# Patient Record
Sex: Female | Born: 1935 | Race: Black or African American | Hispanic: No | Smoking: Never smoker
Health system: Southern US, Community
[De-identification: ages and names within clinical notes are randomized; demographics above are authoritative.]

## PROBLEM LIST (undated history)

## (undated) DIAGNOSIS — R55 Syncope and collapse: Secondary | ICD-10-CM

## (undated) DIAGNOSIS — I509 Heart failure, unspecified: Secondary | ICD-10-CM

## (undated) DIAGNOSIS — E785 Hyperlipidemia, unspecified: Secondary | ICD-10-CM

## (undated) DIAGNOSIS — I10 Essential (primary) hypertension: Secondary | ICD-10-CM

## (undated) HISTORY — DX: Hyperlipidemia, unspecified: E78.5

## (undated) HISTORY — DX: Essential (primary) hypertension: I10

## (undated) HISTORY — DX: Syncope and collapse: R55

---

## 1997-12-10 ENCOUNTER — Ambulatory Visit (HOSPITAL_COMMUNITY): Admission: RE | Admit: 1997-12-10 | Discharge: 1997-12-10 | Payer: Self-pay | Admitting: Gastroenterology

## 1999-04-10 ENCOUNTER — Other Ambulatory Visit: Admission: RE | Admit: 1999-04-10 | Discharge: 1999-04-10 | Payer: Self-pay | Admitting: Obstetrics and Gynecology

## 1999-04-10 ENCOUNTER — Encounter (INDEPENDENT_AMBULATORY_CARE_PROVIDER_SITE_OTHER): Payer: Self-pay | Admitting: Specialist

## 1999-07-01 ENCOUNTER — Encounter: Admission: RE | Admit: 1999-07-01 | Discharge: 1999-07-01 | Payer: Self-pay | Admitting: Family Medicine

## 1999-07-01 ENCOUNTER — Encounter: Payer: Self-pay | Admitting: Family Medicine

## 2000-02-03 ENCOUNTER — Encounter: Admission: RE | Admit: 2000-02-03 | Discharge: 2000-02-03 | Payer: Self-pay | Admitting: Surgery

## 2000-02-03 ENCOUNTER — Encounter: Payer: Self-pay | Admitting: Surgery

## 2000-10-26 ENCOUNTER — Encounter: Payer: Self-pay | Admitting: Obstetrics and Gynecology

## 2000-10-28 ENCOUNTER — Encounter (INDEPENDENT_AMBULATORY_CARE_PROVIDER_SITE_OTHER): Payer: Self-pay | Admitting: Specialist

## 2000-10-28 ENCOUNTER — Ambulatory Visit (HOSPITAL_COMMUNITY): Admission: RE | Admit: 2000-10-28 | Discharge: 2000-10-28 | Payer: Self-pay | Admitting: Obstetrics and Gynecology

## 2000-12-31 ENCOUNTER — Encounter: Payer: Self-pay | Admitting: Endocrinology

## 2000-12-31 ENCOUNTER — Encounter: Admission: RE | Admit: 2000-12-31 | Discharge: 2000-12-31 | Payer: Self-pay | Admitting: Endocrinology

## 2001-01-19 ENCOUNTER — Encounter: Payer: Self-pay | Admitting: Otolaryngology

## 2001-01-19 ENCOUNTER — Encounter: Admission: RE | Admit: 2001-01-19 | Discharge: 2001-01-19 | Payer: Self-pay | Admitting: Otolaryngology

## 2001-07-04 ENCOUNTER — Encounter: Payer: Self-pay | Admitting: Orthopedic Surgery

## 2001-07-04 ENCOUNTER — Encounter: Admission: RE | Admit: 2001-07-04 | Discharge: 2001-07-04 | Payer: Self-pay | Admitting: Orthopedic Surgery

## 2001-07-05 ENCOUNTER — Ambulatory Visit (HOSPITAL_BASED_OUTPATIENT_CLINIC_OR_DEPARTMENT_OTHER): Admission: RE | Admit: 2001-07-05 | Discharge: 2001-07-05 | Payer: Self-pay | Admitting: Orthopedic Surgery

## 2004-01-29 ENCOUNTER — Encounter: Admission: RE | Admit: 2004-01-29 | Discharge: 2004-01-29 | Payer: Self-pay | Admitting: Family Medicine

## 2004-06-10 ENCOUNTER — Encounter: Admission: RE | Admit: 2004-06-10 | Discharge: 2004-06-10 | Payer: Self-pay | Admitting: Family Medicine

## 2004-12-15 ENCOUNTER — Encounter: Admission: RE | Admit: 2004-12-15 | Discharge: 2004-12-15 | Payer: Self-pay | Admitting: Orthopedic Surgery

## 2004-12-18 ENCOUNTER — Ambulatory Visit (HOSPITAL_BASED_OUTPATIENT_CLINIC_OR_DEPARTMENT_OTHER): Admission: RE | Admit: 2004-12-18 | Discharge: 2004-12-18 | Payer: Self-pay | Admitting: Orthopedic Surgery

## 2004-12-18 ENCOUNTER — Ambulatory Visit (HOSPITAL_COMMUNITY): Admission: RE | Admit: 2004-12-18 | Discharge: 2004-12-18 | Payer: Self-pay | Admitting: Orthopedic Surgery

## 2005-01-15 ENCOUNTER — Other Ambulatory Visit: Admission: RE | Admit: 2005-01-15 | Discharge: 2005-01-15 | Payer: Self-pay | Admitting: Family Medicine

## 2005-04-06 ENCOUNTER — Encounter: Admission: RE | Admit: 2005-04-06 | Discharge: 2005-04-06 | Payer: Self-pay | Admitting: Family Medicine

## 2005-09-18 ENCOUNTER — Emergency Department (HOSPITAL_COMMUNITY): Admission: EM | Admit: 2005-09-18 | Discharge: 2005-09-18 | Payer: Self-pay | Admitting: Family Medicine

## 2005-09-18 ENCOUNTER — Ambulatory Visit (HOSPITAL_COMMUNITY): Admission: RE | Admit: 2005-09-18 | Discharge: 2005-09-18 | Payer: Self-pay | Admitting: Family Medicine

## 2006-06-01 ENCOUNTER — Encounter: Admission: RE | Admit: 2006-06-01 | Discharge: 2006-06-01 | Payer: Self-pay | Admitting: Family Medicine

## 2007-09-21 ENCOUNTER — Encounter: Admission: RE | Admit: 2007-09-21 | Discharge: 2007-09-21 | Payer: Self-pay | Admitting: Family Medicine

## 2007-10-06 ENCOUNTER — Encounter: Admission: RE | Admit: 2007-10-06 | Discharge: 2007-10-06 | Payer: Self-pay | Admitting: Family Medicine

## 2008-01-12 ENCOUNTER — Emergency Department (HOSPITAL_COMMUNITY): Admission: EM | Admit: 2008-01-12 | Discharge: 2008-01-12 | Payer: Self-pay | Admitting: Emergency Medicine

## 2009-07-23 ENCOUNTER — Encounter: Admission: RE | Admit: 2009-07-23 | Discharge: 2009-07-23 | Payer: Self-pay | Admitting: Family Medicine

## 2009-08-19 ENCOUNTER — Encounter: Admission: RE | Admit: 2009-08-19 | Discharge: 2009-08-19 | Payer: Self-pay | Admitting: Family Medicine

## 2009-10-02 ENCOUNTER — Encounter: Admission: RE | Admit: 2009-10-02 | Discharge: 2009-10-02 | Payer: Self-pay | Admitting: Family Medicine

## 2010-03-30 ENCOUNTER — Encounter: Payer: Self-pay | Admitting: Family Medicine

## 2010-03-31 ENCOUNTER — Encounter: Payer: Self-pay | Admitting: Family Medicine

## 2010-07-25 NOTE — Op Note (Signed)
Michele Anderson, Michele Anderson            ACCOUNT NO.:  [id number]   MEDICAL RECORD NO.:  [id number]          PATIENT TYPE:  AMB   LOCATION:  DSC                          FACILITY:  [facility]   PHYSICIAN:  Cindee Salt, M.D.       DATE OF BIRTH:  1928/12/13   DATE OF PROCEDURE:  12/18/2004  DATE OF DISCHARGE:                                 OPERATIVE REPORT   PREOPERATIVE DIAGNOSIS:  Carpal tunnel syndrome left hand.   POSTOPERATIVE DIAGNOSIS:  Carpal tunnel syndrome left hand.   OPERATION:  Decompression left median nerve.   SURGEON:  Cindee Salt, M.D.   ASSISTANT:  Carolyne Fiscal R.N.   ANESTHESIA:  Forearm based IV regional.   HISTORY:  The patient is a 77 year old female with a history of carpal  tunnel syndrome. EMG nerve conductions positive. This has not responded to  conservative treatment.  She is desirous of removal. She has undergone right  carpal tunnel release in the past, aware of risks and complications;  questions were answered before the surgery. The arm was then marked by both  the patient and surgeon.   PROCEDURE:  The patient was brought to the operating room where a forearm  based IV regional anesthetic was carried out without difficulty. She was  prepped using DuraPrep, supine position, left arm free. A longitudinal  incision was made in the palm, carried down through subcutaneous tissue.  Bleeders were electrocauterized.  Palmar fascia was split.  Superficial  palmar arch identified.  The flexor tendon to the ring and little finger  identified, to the ulnar side of median nerve, the carpal retinaculum was  incised with sharp dissection.  A right angle and Sewall retractor were  placed between skin and forearm fascia.  The fascia was released for  approximately 1.5 cm proximal to the wrist crease under direct vision.  Canal was explored, tenosynovial tissue was moderately thickened. The area  of deformity to the nerve was apparent. No further lesions were identified.  The  wound was irrigated; and the skin was closed with interrupted 5-0 nylon  sutures. A sterile compressive dressing and splint was applied. The patient  tolerated the procedure well and was taken to the recovery room for  observation in satisfactory condition. She is discharged home to return to  the [facility] of [place] in 1 week on Vicodin.           ______________________________  Cindee Salt, M.D.     GK/MEDQ  D:  12/18/2004  T:  12/18/2004  Job:  [id number]

## 2010-07-25 NOTE — Op Note (Signed)
[place]  Patient:    Michele Anderson, Michele Anderson Visit Number: [id number] MRN: [id number]          Service Type: DSU Location: [facility] Attending Physician:  Ronne Binning Dictated by:   Nicki Reaper, M.D. Proc. Date: 07/05/01 Admit Date:  07/05/2001                             Operative Report  PREOPERATIVE DIAGNOSIS:  Carpal tunnel syndrome, right hand.  POSTOPERATIVE DIAGNOSIS:  Carpal tunnel syndrome, right hand.  OPERATION PERFORMED:  Decompression of right median nerve.  SURGEON:  Nicki Reaper, M.D.  ASSISTANT:  Joaquin Courts, R.N.  ANESTHESIA:  Forearm-based IV regional.  ANESTHESIOLOGIST:  Janetta Hora. Gelene Mink, M.D.  INDICATIONS:  The patient is a 77 year old female with a history of carpal tunnel syndrome.  EMG and nerve conductions are positive, which has not responded to conservative treatment.  DESCRIPTION OF PROCEDURE:  The patient was brought to the operating room where a forearm-based IV regional anesthetic was carried out without difficulty. She was prepped and draped using Betadine scrub and solution, with the right arm free.  A longitudinal incision was made in the palm and carried down through the subcutaneous tissue.  Bleeders were electrocauterized.  The palmar fascia was split.  The superficial palmar arch was identified.  The flexor tendon to the ring and little finger were identified to the ulnar side of the median nerve.  The carpal retinaculum was incised with sharp dissection.  A right angle and Sewall retractor were placed between the skin and the forearm fascia.  The fascia was released for approximately 3.0 cm proximal to the wrist crease under direct vision.  The canal was explored.  No further lesions were identified.  There was a persistent median artery.  This had not thrombosed, and was not resected.  The wound was irrigated.  The skin was closed with interrupted #5-0 nylon sutures.  A sterile compressive  dressing and splint were applied.  The patient tolerated the procedure well and was taken to the recovery room for observation, in satisfactory condition.  DISPOSITION:  She is to discharged home, to return to the [facility] of [place] in one week, on Vicodin and Keflex. Dictated by:   Nicki Reaper, M.D. Attending Physician:  Ronne Binning DD:  07/05/01 TD:  07/05/01 Job: [id number]

## 2010-07-25 NOTE — Op Note (Signed)
[facility]  Patient:    Michele Anderson, Michele Anderson Visit Number: [id number] MRN: [id number]          Service Type: DSU Location: DAY Attending Physician:  Lendon Colonel Dictated by:   Kathie Rhodes. Kyra Manges, M.D. Proc. Date: 10/28/00 Adm. Date:  10/28/2000                             Operative Report  PREOPERATIVE DIAGNOSES:  Postmenopausal bleeding.  Uterine fibroids.  POSTOPERATIVE DIAGNOSES:  Postmenopausal bleeding.  Uterine fibroids.  OPERATION:  Hysteroscopy with resection.  DESCRIPTION OF PROCEDURE:  The patient was placed in lithotomy position; prepped and draped in the usual fashion.  Cervix was dilated to the endometrial cavity very carefully to accept the hysteroscope.  The resectoscope was placed into the cervix.  Visualization of pelvis revealed a large posterior myoma, which was resected, along with what appeared to be an irregular uterine cavity with suggestion of subset uterus.  All the areas in the myometrium that were irregular were resected and sent to the lab for study.  Bleeding areas were cauterized.  No unusual blood loss occurred.  At the termination of the procedure I injected the uterus with about 20 cc of a ______  solution.  Ms. Agramonte tolerated this well.  Was sent to recovery room in good condition. Dictated by:   S. Kyra Manges, M.D. Attending Physician:  Lendon Colonel DD:  10/28/00 TD:  10/29/00 Job: [id number]

## 2010-10-01 ENCOUNTER — Other Ambulatory Visit: Payer: Self-pay | Admitting: Internal Medicine

## 2010-10-01 DIAGNOSIS — Z1231 Encounter for screening mammogram for malignant neoplasm of breast: Secondary | ICD-10-CM

## 2010-10-23 ENCOUNTER — Ambulatory Visit
Admission: RE | Admit: 2010-10-23 | Discharge: 2010-10-23 | Disposition: A | Discharge status: Home / Self Care | Payer: Medicare Other | Source: Ambulatory Visit | Attending: Internal Medicine | Admitting: Internal Medicine

## 2010-10-23 DIAGNOSIS — Z1231 Encounter for screening mammogram for malignant neoplasm of breast: Secondary | ICD-10-CM

## 2010-12-09 LAB — CBC
HCT: 37.6
Hemoglobin: 11.9 — ABNORMAL LOW
MCHC: 31.7
MCV: 72.8 — ABNORMAL LOW
Platelets: 207
RBC: 5.17 — ABNORMAL HIGH
RDW: 16.2 — ABNORMAL HIGH
WBC: 8.2

## 2010-12-09 LAB — DIFFERENTIAL
Basophils Absolute: 0
Basophils Relative: 0
Eosinophils Absolute: 0
Eosinophils Relative: 0
Lymphocytes Relative: 10 — ABNORMAL LOW
Lymphs Abs: 0.8
Monocytes Absolute: 0.4
Monocytes Relative: 5
Neutro Abs: 7
Neutrophils Relative %: 86 — ABNORMAL HIGH

## 2010-12-09 LAB — URINALYSIS, ROUTINE W REFLEX MICROSCOPIC
Bilirubin Urine: NEGATIVE
Glucose, UA: NEGATIVE
Hgb urine dipstick: NEGATIVE
Nitrite: NEGATIVE
Protein, ur: NEGATIVE
Specific Gravity, Urine: 1.022
Urobilinogen, UA: 0.2
pH: 5.5

## 2010-12-09 LAB — COMPREHENSIVE METABOLIC PANEL
ALT: 13
AST: 22
Albumin: 3.3 — ABNORMAL LOW
Alkaline Phosphatase: 64
BUN: 12
CO2: 28
Calcium: 8.8
Chloride: 103
Creatinine, Ser: 1.05
GFR calc Af Amer: >60
GFR calc non Af Amer: 51 — ABNORMAL LOW
Glucose, Bld: 132 — ABNORMAL HIGH
Potassium: 3.2 — ABNORMAL LOW
Sodium: 139
Total Bilirubin: 0.8
Total Protein: 6.8

## 2010-12-09 LAB — POCT CARDIAC MARKERS
CKMB, poc: 1.2
Myoglobin, poc: 169
Troponin i, poc: <0.05

## 2011-08-15 ENCOUNTER — Emergency Department (HOSPITAL_COMMUNITY): Payer: Medicare Other

## 2011-08-15 ENCOUNTER — Other Ambulatory Visit: Payer: Self-pay

## 2011-08-15 ENCOUNTER — Inpatient Hospital Stay (HOSPITAL_COMMUNITY)
Admission: EM | Admit: 2011-08-15 | Discharge: 2011-08-17 | DRG: 293 | Disposition: A | Discharge status: Home / Self Care | Payer: Medicare Other | Attending: Internal Medicine | Admitting: Internal Medicine

## 2011-08-15 ENCOUNTER — Encounter (HOSPITAL_COMMUNITY): Payer: Self-pay | Admitting: *Deleted

## 2011-08-15 DIAGNOSIS — I509 Heart failure, unspecified: Secondary | ICD-10-CM | POA: Diagnosis present

## 2011-08-15 DIAGNOSIS — Z7982 Long term (current) use of aspirin: Secondary | ICD-10-CM

## 2011-08-15 DIAGNOSIS — R55 Syncope and collapse: Secondary | ICD-10-CM

## 2011-08-15 DIAGNOSIS — I10 Essential (primary) hypertension: Secondary | ICD-10-CM | POA: Diagnosis present

## 2011-08-15 DIAGNOSIS — I5021 Acute systolic (congestive) heart failure: Secondary | ICD-10-CM

## 2011-08-15 DIAGNOSIS — E782 Mixed hyperlipidemia: Secondary | ICD-10-CM

## 2011-08-15 DIAGNOSIS — E785 Hyperlipidemia, unspecified: Secondary | ICD-10-CM | POA: Diagnosis present

## 2011-08-15 DIAGNOSIS — Z66 Do not resuscitate: Secondary | ICD-10-CM | POA: Diagnosis present

## 2011-08-15 HISTORY — DX: Heart failure, unspecified: I50.9

## 2011-08-15 LAB — COMPREHENSIVE METABOLIC PANEL
Albumin: 3.6 g/dL (ref 3.5–5.2)
BUN: 14 mg/dL (ref 6–23)
Calcium: 9.1 mg/dL (ref 8.4–10.5)
Creatinine, Ser: 0.88 mg/dL (ref 0.50–1.10)
GFR calc Af Amer: 70 mL/min — ABNORMAL LOW (ref >90)
Glucose, Bld: 111 mg/dL — ABNORMAL HIGH (ref 70–99)
Total Protein: 7 g/dL (ref 6.0–8.3)

## 2011-08-15 LAB — CBC
Hemoglobin: 12.1 g/dL (ref 12.0–15.0)
MCH: 23 pg — ABNORMAL LOW (ref 26.0–34.0)
MCHC: 33.2 g/dL (ref 30.0–36.0)
MCV: 69.4 fL — ABNORMAL LOW (ref 78.0–100.0)

## 2011-08-15 LAB — DIFFERENTIAL
Basophils Relative: 1 % (ref 0–1)
Eosinophils Absolute: 0.3 10*3/uL (ref 0.0–0.7)
Eosinophils Relative: 4 % (ref 0–5)
Monocytes Absolute: 0.3 10*3/uL (ref 0.1–1.0)
Monocytes Relative: 5 % (ref 3–12)
Neutrophils Relative %: 69 % (ref 43–77)

## 2011-08-15 LAB — CARDIAC PANEL(CRET KIN+CKTOT+MB+TROPI)
CK, MB: 3.3 ng/mL (ref 0.3–4.0)
Relative Index: 1.8 (ref 0.0–2.5)
Total CK: 184 U/L — ABNORMAL HIGH (ref 7–177)

## 2011-08-15 LAB — PRO B NATRIURETIC PEPTIDE: Pro B Natriuretic peptide (BNP): 1899 pg/mL — ABNORMAL HIGH (ref 0–450)

## 2011-08-15 MED ORDER — CARVEDILOL 25 MG PO TABS
25.0000 mg | ORAL_TABLET | Freq: Two times a day (BID) | ORAL | Status: DC
  Administered 2011-08-15 – 2011-08-17 (×4): 25 mg via ORAL
  Filled 2011-08-15 (×5): qty 1

## 2011-08-15 MED ORDER — SODIUM CHLORIDE 0.9 % IV SOLN
INTRAVENOUS | Status: AC
  Administered 2011-08-15: 75 mL/h via INTRAVENOUS

## 2011-08-15 MED ORDER — ENOXAPARIN SODIUM 40 MG/0.4ML ~~LOC~~ SOLN
40.0000 mg | SUBCUTANEOUS | Status: DC
  Administered 2011-08-15 – 2011-08-16 (×2): 40 mg via SUBCUTANEOUS
  Filled 2011-08-15 (×3): qty 0.4

## 2011-08-15 MED ORDER — FUROSEMIDE 10 MG/ML IJ SOLN
40.0000 mg | Freq: Two times a day (BID) | INTRAMUSCULAR | Status: DC
  Administered 2011-08-15 – 2011-08-16 (×2): 40 mg via INTRAVENOUS
  Filled 2011-08-15 (×3): qty 4

## 2011-08-15 MED ORDER — SODIUM CHLORIDE 0.9 % IJ SOLN: 3.0000 mL | INTRAMUSCULAR | Status: DC | PRN

## 2011-08-15 MED ORDER — ASPIRIN EC 81 MG PO TBEC
81.0000 mg | DELAYED_RELEASE_TABLET | Freq: Every day | ORAL | Status: DC
  Administered 2011-08-16 – 2011-08-17 (×2): 81 mg via ORAL
  Filled 2011-08-15 (×2): qty 1

## 2011-08-15 MED ORDER — SODIUM CHLORIDE 0.9 % IJ SOLN
3.0000 mL | Freq: Two times a day (BID) | INTRAMUSCULAR | Status: DC
  Administered 2011-08-15 – 2011-08-17 (×4): 3 mL via INTRAVENOUS

## 2011-08-15 MED ORDER — SODIUM CHLORIDE 0.9 % IV SOLN: 250.0000 mL | INTRAVENOUS | Status: DC | PRN

## 2011-08-15 MED ORDER — ACETAMINOPHEN 325 MG PO TABS: 650.0000 mg | ORAL_TABLET | ORAL | Status: DC | PRN

## 2011-08-15 MED ORDER — ONDANSETRON HCL 4 MG/2ML IJ SOLN: 4.0000 mg | Freq: Three times a day (TID) | INTRAMUSCULAR | Status: AC | PRN

## 2011-08-15 MED ORDER — LIVING BETTER WITH HEART FAILURE BOOK
Freq: Once | Status: DC
  Filled 2011-08-15 (×2): qty 1

## 2011-08-15 MED ORDER — IRBESARTAN 150 MG PO TABS
150.0000 mg | ORAL_TABLET | Freq: Every day | ORAL | Status: DC
  Administered 2011-08-16 – 2011-08-17 (×2): 150 mg via ORAL
  Filled 2011-08-15 (×2): qty 1

## 2011-08-15 MED ORDER — ATORVASTATIN CALCIUM 40 MG PO TABS
40.0000 mg | ORAL_TABLET | Freq: Every day | ORAL | Status: DC
  Administered 2011-08-15 – 2011-08-17 (×3): 40 mg via ORAL
  Filled 2011-08-15 (×4): qty 1

## 2011-08-15 NOTE — H&P (Signed)
PCP:   Darrow Bussing, MD, MD   Chief Complaint:  Syncope  HPI: Patient is an 78 year old African American female past medical history of hypertension, hyperlipidemia and chronic systolic heart failure with an ejection fraction of 45% who presented to the emergency room after a syncopal episode. The patient was playing bingo today when all is said she started feeling lightheaded and needed she was going to pass out and she did. When I met with the patient, she told me that she's been passing out for a very long time, it the point where she stated that she was passing i.e. when she was very young. It is unclear if this is a been worked up. The patient says that she does not think so. She was evaluated by EMS and brought into the emergency room. She was noted to not be hyperglycemic or hypotensive. She was mildly bradycardic with a heart rate in the high 50s. In the emergency room all of her lab work was unremarkable including normal glucose normal electrolytes and normal renal function. She had no white count or shift. The only findings were an enlarged heart on chest x-ray with no signs of any pleural effusions. Because of her history of congestive heart failure, a BNP was ordered and found to be elevated at 1900. Hospitals were called for evaluation and admission.  Review of Systems:  Met with the patient in the emergency room, she was doing okay. She denies any headaches, vision changes, dysphasia, chest pain, palpitations, shortness of breath, wheeze, cough, abdominal pain, hematuria, dysuria, constipation, diarrhea, focal extremity numbness weakness or pain. She is otherwise no complaints. Review systems otherwise negative. She states that she's had the syncopal episode before and always feeling about to come on.  Past Medical History: Past Medical History  Diagnosis Date  . CHF (congestive heart failure)    hypertension Hyperlipidemia   Medications: Prior to Admission medications     Medication Sig Start Date End Date Taking? Authorizing Provider  aspirin EC 81 MG tablet Take 81 mg by mouth daily.   Yes Historical Provider, MD  atorvastatin (LIPITOR) 40 MG tablet Take 40 mg by mouth daily.   Yes Historical Provider, MD  carvedilol (COREG) 25 MG tablet Take 25 mg by mouth 2 (two) times daily.   Yes Historical Provider, MD  furosemide (LASIX) 20 MG tablet Take 20 mg by mouth daily.   Yes Historical Provider, MD  telmisartan (MICARDIS) 40 MG tablet Take 40 mg by mouth daily.   Yes Historical Provider, MD    Allergies:  No Known Allergies  Social History:  does not have a smoking history on file. She does not have any smokeless tobacco history on file. Her alcohol and drug histories not on file. The patient is normally at baseline able to participate in most activities of daily living without assistance, she does not use a walker or cane. She lives in independent living facility by herself.  Family History: Hypertension  Physical Exam: Filed Vitals:   08/15/11 1633 08/15/11 1636 08/15/11 1806 08/15/11 1815  BP: 132/89 144/73 158/73 148/67  Pulse: 61 67 63 63  Temp:      Resp: 16  16 19   SpO2:   100% 100%   General: Alert and oriented x3, no acute distress, looks younger than stated age, fatigued HEENT: Normocephalic and atraumatic, mucous membranes are slightly dry Cardiovascular: Regular rate and rhythm, S1-S2, soft 2/6 systolic ejection murmur Lungs: Clear to auscultation bilaterally Abdomen: Soft, nontender, nondistended, positive bowel sounds  Extremities: No clubbing or cyanosis, trace pitting edema   Labs on Admission:   [facility] 08/15/11 1626  NA 138  K 3.7  CL 104  CO2 24  GLUCOSE 111*  BUN 14  CREATININE 0.88  CALCIUM 9.1  MG --  PHOS --    Basename 08/15/11 1626  AST 23  ALT 12  ALKPHOS 82  BILITOT 1.0  PROT 7.0  ALBUMIN 3.6    Basename 08/15/11 1626  WBC 6.5  NEUTROABS 4.5  HGB 12.1  HCT 36.5  MCV 69.4*  PLT 169     Basename 08/15/11 1643  CKTOTAL 184*  CKMB 3.3  CKMBINDEX --  TROPONINI <0.30   Echocardiogram-done in December 2012 at [place] primary care office: Concentric hypertrophy, EF of 45%.  Radiological Exams on Admission:  Dg Chest 2 View 08/15/2011  .  IMPRESSION: Stable mild cardiomegaly.  No acute findings.     Ct Head Wo Contrast 08/15/2011  IMPRESSION: No acute intracranial abnormality.  Mild cerebral atrophy.     Assessment/Plan Present on Admission:  .Hypertension: Stable. Continue home medications already she is on an ARB and beta blocker.  .Syncope and collapse: Unclear etiology. She states this is been on for a long time. I question if some of her heart failure may be playing a role in this. She was not aware that she was in any kind of failure. Echo was done in November. We'll plan to diurese and ambulate. She's feeling better tomorrow, could discharge home with plans to follow up quickly in the outpatient office.   .Acute systolic CHF (congestive heart failure): Stable. She did not seem to be well educated about her congestive heart failure. We'll provide lots of education. Diurese and recheck labs in the morning. Check daily weights.  .Hyperlipidemia: Stable. Continue statin.  After discussion with the patient, she is to be a DO NOT RESUSCITATE.  We will respect these wishes.  I anticipate her length of stay to be overnight based on stability, unless something should change.  Time spent on this patient including examination and decision-making process: 40 minutes.  Hollice Espy [phone] 08/15/2011, 7:16 PM

## 2011-08-15 NOTE — ED Notes (Signed)
cbg of 116. Informed rn barb.

## 2011-08-15 NOTE — ED Provider Notes (Signed)
History     CSN: [id number]  Arrival date & time 08/15/11  1529   First MD Initiated Contact with Patient 08/15/11 1537      Chief Complaint  Patient presents with  . Loss of Consciousness    (Consider location/radiation/quality/duration/timing/severity/associated sxs/prior treatment) HPI Comments: From senior living while sitting down playing bingo.  PAtient "knew it was going to happen" and felt lightheaded.  The next thing she remembers she was on the floor.  Feels at baseline now.  No chest pain, SOB, fever, chills.  NO headache, neck pain, back pain. States she has syncopal episodes "all the time" starting when she was [age] years old.  Denies any previous cardiac workup.  Patient is a 78 y.o. female presenting with syncope. The history is provided by the patient.  Loss of Consciousness Pertinent negatives include no chest pain, no abdominal pain, no headaches and no shortness of breath.    Past Medical History  Diagnosis Date  . CHF (congestive heart failure)     History reviewed. No pertinent past surgical history.  No family history on file.  History  Substance Use Topics  . Smoking status: Not on file  . Smokeless tobacco: Not on file  . Alcohol Use:     OB History    Grav Para Term Preterm Abortions TAB SAB Ect Mult Living                  Review of Systems  Constitutional: Negative for fever and activity change.  HENT: Negative for congestion and rhinorrhea.   Eyes: Negative for visual disturbance.  Respiratory: Negative for cough, chest tightness and shortness of breath.   Cardiovascular: Positive for syncope. Negative for chest pain.  Gastrointestinal: Negative for nausea, vomiting and abdominal pain.  Genitourinary: Negative for dysuria.  Musculoskeletal: Negative for back pain.  Skin: Negative for rash.  Neurological: Positive for dizziness, syncope and light-headedness. Negative for headaches.    Allergies  Review of patient's allergies indicates  no known allergies.  Home Medications   Current Outpatient Rx  Name Route Sig Dispense Refill  . ASPIRIN EC 81 MG PO TBEC Oral Take 81 mg by mouth daily.    . ATORVASTATIN CALCIUM 40 MG PO TABS Oral Take 40 mg by mouth daily.    Marland Kitchen CARVEDILOL 25 MG PO TABS Oral Take 25 mg by mouth 2 (two) times daily.    . FUROSEMIDE 20 MG PO TABS Oral Take 20 mg by mouth daily.    . TELMISARTAN 40 MG PO TABS Oral Take 40 mg by mouth daily.      BP 148/67  Pulse 63  Temp 97.9 F (36.6 C)  Resp 19  SpO2 100%  Physical Exam  Constitutional: She is oriented to person, place, and time. She appears well-developed and well-nourished. No distress.  HENT:  Head: Normocephalic and atraumatic.  Mouth/Throat: Oropharynx is clear and moist. No oropharyngeal exudate.  Eyes: Conjunctivae are normal. Pupils are equal, round, and reactive to light.  Neck: Normal range of motion.  Cardiovascular: Normal rate, regular rhythm and normal heart sounds.   No murmur heard. Pulmonary/Chest: Effort normal and breath sounds normal. No respiratory distress.  Abdominal: Soft. There is no tenderness. There is no rebound and no guarding.  Musculoskeletal: Normal range of motion. She exhibits no edema and no tenderness.  Neurological: She is alert and oriented to person, place, and time. No cranial nerve deficit.       CN 3-12 intact, no ataxia on  finger to nose, 5/5 strength throughout, no nystagmus  Skin: Skin is warm.    ED Course  Procedures (including critical care time)  Labs Reviewed  CBC - Abnormal; Notable for the following:    RBC 5.26 (*)    MCV 69.4 (*)    MCH 23.0 (*)    RDW 16.1 (*)    All other components within normal limits  COMPREHENSIVE METABOLIC PANEL - Abnormal; Notable for the following:    Glucose, Bld 111 (*)    GFR calc non Af Amer 60 (*)    GFR calc Af Amer 70 (*)    All other components within normal limits  CARDIAC PANEL(CRET KIN+CKTOT+MB+TROPI) - Abnormal; Notable for the following:      Total CK 184 (*)    All other components within normal limits  PRO B NATRIURETIC PEPTIDE - Abnormal; Notable for the following:    Pro B Natriuretic peptide (BNP) 1899.0 (*)    All other components within normal limits  GLUCOSE, CAPILLARY - Abnormal; Notable for the following:    Glucose-Capillary 116 (*)    All other components within normal limits  DIFFERENTIAL   Dg Chest 2 View  08/15/2011  *RADIOLOGY REPORT*  Clinical Data: Loss of consciousness  CHEST - 2 VIEW  Comparison: Chest radiograph 03/12/2010  Findings: The stable mild cardiomegaly.  Stable thoracic aorta and hilar contours.  The lungs are clear.  Negative for pleural effusion.  No acute bony abnormality identified.  IMPRESSION: Stable mild cardiomegaly.  No acute findings.  Original Report Authenticated By: Britta Mccreedy, M.D.   Ct Head Wo Contrast  08/15/2011  *RADIOLOGY REPORT*  Clinical Data: Dizziness, syncope  CT HEAD WITHOUT CONTRAST  Technique:  Contiguous axial images were obtained from the base of the skull through the vertex without contrast.  Comparison: 01/12/2008  Findings: No skull fracture is noted.  No intracranial hemorrhage, mass effect or midline shift.  No acute infarction.  Paranasal sinuses and mastoid air cells are unremarkable.  No intra or extra  axial fluid collection.  No mass lesion is noted on this unenhanced scan.  Mild cerebral atrophy.  IMPRESSION: No acute intracranial abnormality.  Mild cerebral atrophy.  Original Report Authenticated By: Natasha Mead, M.D.     1. Syncope       MDM  Syncopal episode from sitting position preceded by lightheadedness.  Poor PO intake today.  No chest pain, SOB, neuro deficits. No vertigo or nystagmus.  EKG bradycardic.  CT head negative. Orthostatics negative.  Lab unremarkable.  Observation admission d/w Dr. Rito Ehrlich for syncope workup.   Date: 08/15/2011  Rate: 53  Rhythm: normal sinus rhythm  QRS Axis: left  Intervals: PR prolonged  ST/T Wave  abnormalities: nonspecific ST/T changes  Conduction Disutrbances:left anterior fascicular block  Narrative Interpretation: LVH, biphasic T waves laterally  Old EKG Reviewed: changes noted    Glynn Octave, MD 08/15/11 1900

## 2011-08-15 NOTE — ED Notes (Signed)
Pt from [facility]. Pt was sitting down playing cards and had syncopal episode. Pt fell out of chair. Huston Foley on the monitor. Pt sts hx of same. No injuries noted. Pt A&Ox3.

## 2011-08-15 NOTE — ED Notes (Signed)
MD at bedside. 

## 2011-08-16 DIAGNOSIS — I10 Essential (primary) hypertension: Secondary | ICD-10-CM

## 2011-08-16 DIAGNOSIS — E782 Mixed hyperlipidemia: Secondary | ICD-10-CM

## 2011-08-16 DIAGNOSIS — R55 Syncope and collapse: Secondary | ICD-10-CM

## 2011-08-16 DIAGNOSIS — I5021 Acute systolic (congestive) heart failure: Secondary | ICD-10-CM

## 2011-08-16 LAB — BASIC METABOLIC PANEL
BUN: 12 mg/dL (ref 6–23)
Chloride: 106 mEq/L (ref 96–112)
Glucose, Bld: 86 mg/dL (ref 70–99)
Potassium: 4.1 mEq/L (ref 3.5–5.1)
Sodium: 140 mEq/L (ref 135–145)

## 2011-08-16 MED ORDER — FUROSEMIDE 10 MG/ML IJ SOLN
40.0000 mg | Freq: Two times a day (BID) | INTRAMUSCULAR | Status: DC
  Administered 2011-08-17: 40 mg via INTRAVENOUS
  Filled 2011-08-16 (×3): qty 4

## 2011-08-16 NOTE — Progress Notes (Signed)
1200 ambulated around the romm, bathroom with supervision. Steady gait

## 2011-08-16 NOTE — Progress Notes (Signed)
Subjective: Patient feeling okay. No lightheadedness or dizziness. No shortness of breath or chest pain.  Objective: Weight change:   Intake/Output Summary (Last 24 hours) at 08/16/11 1339 Last data filed at 08/16/11 1045  Gross per 24 hour  Intake    463 ml  Output   2250 ml  Net  -1787 ml    Filed Vitals:   08/16/11 0831  BP: 109/88  Pulse: 78  Temp:   Resp: 18   General: Alert and oriented x3, no acute distress HEENT: Normocephalic, atraumatic, mucous her meds are moist Cardiovascular: Regular rate and rhythm, S1-S2 soft 2/6 systolic ejection murmur Lungs: Clear to auscultation bilaterally Abdomen: Soft, nontender, nondistended, positive bowel sounds Extremities: No clubbing or cyanosis, trace bilateral pitting edema  Lab Results: Basic Metabolic Panel:  Basename 08/16/11 0620 08/15/11 1626  NA 140 138  K 4.1 3.7  CL 106 104  CO2 22 24  GLUCOSE 86 111*  BUN 12 14  CREATININE 0.84 0.88  CALCIUM 8.8 9.1  MG -- --  PHOS -- --   Liver Function Tests:  Basename 08/15/11 1626  AST 23  ALT 12  ALKPHOS 82  BILITOT 1.0  PROT 7.0  ALBUMIN 3.6   CBC:  Basename 08/15/11 1626  WBC 6.5  NEUTROABS 4.5  HGB 12.1  HCT 36.5  MCV 69.4*  PLT 169   Cardiac Enzymes:  Basename 08/15/11 1643  CKTOTAL 184*  CKMB 3.3  CKMBINDEX --  TROPONINI <0.30   BNP:  Basename 08/16/11 0620 08/15/11 1643  PROBNP 2620.0* 1899.0*   CBG:  Basename 08/15/11 1657  GLUCAP 116*   Studies/Results:  Dg Chest 2 View 08/15/2011 IMPRESSION: Stable mild cardiomegaly.  No acute findings.    Ct Head Wo Contrast 08/15/2011    IMPRESSION: No acute intracranial abnormality.  Mild cerebral atrophy.     Medications: Scheduled Meds:   . sodium chloride   Intravenous STAT  . a stronger pump book   Does not apply Once  . aspirin EC  81 mg Oral Daily  . atorvastatin  40 mg Oral Daily  . carvedilol  25 mg Oral BID  . enoxaparin  40 mg Subcutaneous Q24H  . furosemide  40 mg  Intravenous Q12H  . irbesartan  150 mg Oral Daily  . sodium chloride  3 mL Intravenous Q12H   Continuous Infusions:  PRN Meds:.sodium chloride, acetaminophen, ondansetron (ZOFRAN) IV, sodium chloride  Assessment/Plan: Patient Active Hospital Problem List: Acute systolic CHF (congestive heart failure) (08/15/2011) Started diuresis on this patient. Normally she's only of 40 of Lasix once a day and a started IV. She started to feel liters. Her increased BNP today is likely attributed to the fact that she received IV fluids in the emergency room. Patient not be well educated about heart failure and so we'll plan to provide education as well. Had a long talk with the patient and her grandson. She is quite eager to improve her condition. Check a followup BNP and electrolytes in the morning.  Hypertension (08/15/2011) Blood pressure stable and tolerating increase Lasix.  Syncope and collapse (08/15/2011) The patient and her grandson were able to better clarify how often she passes out. Apparently it used to be very infrequent perhaps once or twice a year. However it has been occurring more frequently in the past year every few months. The patient does admit that she does still drive and I told her I was concerned about what could happen if she passed out while driving. She had  2-D echo which was done in December of 2012 and his essentially unremarkable for pertinent findings. I'm also going to check a carotid Dopplers while she is here and continue to diurese her. Possible discharge home tomorrow with close followup with her PCP.  Hyperlipidemia (08/15/2011) Stable. Continue statin.   LOS: 1 day   Timur Nibert K 08/16/2011, 1:39 PM

## 2011-08-16 NOTE — Progress Notes (Signed)
Patient arrived to unit from ED via stretcher.  Patient able to ambulate to bed.  Admission weight & vital signs completed, see flowsheets.  Patient oriented to unit.  Fall plan reviewed and safety video viewed.  Call button within reach. Will continue to monitor.  Michele Anderson

## 2011-08-17 DIAGNOSIS — I10 Essential (primary) hypertension: Secondary | ICD-10-CM

## 2011-08-17 DIAGNOSIS — E782 Mixed hyperlipidemia: Secondary | ICD-10-CM

## 2011-08-17 DIAGNOSIS — R55 Syncope and collapse: Secondary | ICD-10-CM

## 2011-08-17 DIAGNOSIS — I5021 Acute systolic (congestive) heart failure: Secondary | ICD-10-CM

## 2011-08-17 LAB — BASIC METABOLIC PANEL
BUN: 15 mg/dL (ref 6–23)
CO2: 29 mEq/L (ref 19–32)
Chloride: 102 mEq/L (ref 96–112)
Creatinine, Ser: 0.98 mg/dL (ref 0.50–1.10)
Glucose, Bld: 92 mg/dL (ref 70–99)

## 2011-08-17 MED ORDER — POTASSIUM CHLORIDE CRYS ER 20 MEQ PO TBCR
20.0000 meq | EXTENDED_RELEASE_TABLET | Freq: Once | ORAL | Status: AC
  Administered 2011-08-17: 20 meq via ORAL
  Filled 2011-08-17: qty 1

## 2011-08-17 MED ORDER — FUROSEMIDE 40 MG PO TABS: 40.0000 mg | ORAL_TABLET | Freq: Every day | ORAL | Status: AC

## 2011-08-17 NOTE — Progress Notes (Signed)
Clinical Social Work Department BRIEF PSYCHOSOCIAL ASSESSMENT 08/17/2011  Patient:  Michele Anderson, Michele Anderson     Account Number:  [id number]     Admit date:  08/15/2011  Clinical Social Worker:  Juliette Mangle  Date/Time:  08/17/2011 01:32 PM  Referred by:  Physician  Date Referred:  08/17/2011 Referred for  Other - See comment   Other Referral:   From a facility   Interview type:  Patient Other interview type:   Patient's grandson Michele Anderson    PSYCHOSOCIAL DATA Living Status:  FACILITY Admitted from facility:   Level of care:  Independent Living Primary support name:  Michele Anderson Primary support relationship to patient:  FAMILY Degree of support available:   Good    CURRENT CONCERNS Current Concerns  Post-Acute Placement   Other Concerns:    SOCIAL WORK ASSESSMENT / PLAN CSW received a referral that patient is from a facility. CSW met with patient and her grandson, Michele Anderson at the bedside. CSW introduced self, explained role and discussed returning to the facility. Patient reported that she is from [place] and independent living facility. She loves the facility and is agreeable to returning. Patient reported no symptoms of depression and reported good support from her grandson. CSW will sign off as Social work intervention is no longer needed.   Assessment/plan status:  No Further Intervention Required Other assessment/ plan:   Information/referral to community resources:   Patient did not need any community referrals    PATIENT'S/FAMILY'S RESPONSE TO PLAN OF CARE: Patient and patient's family were appreciative and receptive to support and information provided by CSW. CSW will sign off as social work intervention is no longer needed.   Sabino Niemann, MSW, [employer] [phone]

## 2011-08-17 NOTE — Progress Notes (Signed)
VASCULAR LAB PRELIMINARY  PRELIMINARY  PRELIMINARY  PRELIMINARY  Carotid duplex has been completed.    Preliminary report: No significant plaque or ICA stenosis noted bilaterally.  The right vertebral artery was not insonated.  Left vertebral artery flow antegrade.  Vanna Scotland,  RVT 08/17/2011, 12:38 PM

## 2011-08-17 NOTE — Progress Notes (Signed)
UR COMPLETED  

## 2011-08-17 NOTE — Discharge Instructions (Signed)
Syncope You have had a fainting (syncopal) spell. A fainting episode is a sudden, short-lived loss of consciousness. It results in complete recovery. It occurs because there has been a temporary shortage of oxygen and/or sugar (glucose) to the brain. CAUSES   Blood pressure pills and other medications that may lower blood pressure below normal. Sudden changes in posture (sudden standing).   Over-medication. Take your medications as directed.   Standing too long. This can cause blood to pool in the legs.   Seizure disorders.   Low blood sugar (hypoglycemia) of diabetes. This more commonly causes coma.   Bearing down to go to the bathroom. This can cause your blood pressure to rise suddenly. Your body compensates by making the blood pressure too low when you stop bearing down.   Hardening of the arteries where the brain temporarily does not receive enough blood.   Irregular heart beat and circulatory problems.   Fear, emotional distress, injury, sight of blood, or illness.  Your caregiver will send you home if the syncope was from non-worrisome causes (benign). Depending on your age and health, you may stay to be monitored and observed. If you return home, have someone stay with you if your caregiver feels that is desirable. It is very important to keep all follow-up referrals and appointments in order to properly manage this condition. This is a serious problem which can lead to serious illness and death if not carefully managed.  WARNING: Do not drive or operate machinery until your caregiver feels that it is safe for you to do so. SEEK IMMEDIATE MEDICAL CARE IF:   You have another fainting episode or faint while lying or sitting down. DO NOT DRIVE YOURSELF. Call 911 if no other help is available.   You have chest pain, are feeling sick to your stomach (nausea), vomiting or abdominal pain.   You have an irregular heartbeat or one that is very fast (pulse over 120 beats per minute).    You have a loss of feeling in some part of your body or lose movement in your arms or legs.   You have difficulty with speech, confusion, severe weakness, or visual problems.   You become sweaty and/or feel light headed.  Make sure you are rechecked as instructed. Document Released: 02/23/2005 Document Revised: 02/12/2011 Document Reviewed: 10/14/2006 [employer] Patient Information 2012 [employer]. 

## 2011-08-17 NOTE — Discharge Summary (Signed)
DISCHARGE SUMMARY  Michele Anderson  MR#: [id number]  DOB:03-17-1928  Date of Admission: 08/15/2011 Date of Discharge: 08/17/2011  Attending Physician:Danniel Grenz K  Patient's UXL:KGMWNUU,VOZDG, MD, MD  Consults: -none  Discharge Diagnoses: Present on Admission:  .Hypertension .Syncope and collapse .Acute systolic CHF (congestive heart failure) .Hyperlipidemia   Initial presentation: Patient is a very pleasant 78 year old African American female with past medical history of systolic heart failure and hypertension who presented to the emergency room after syncopal episode. She was omitted for further workup and found to have an elevated BNP around 1900.  Hospital Course: Patient Active Problem List  Diagnoses  . Hypertension: Stable. Continued on her oral medications.   . Syncope and collapse: In discussion with the patient, she admitted that actually she has had a number of syncopal events. He started out when she was very young and were occurring about one or 2 times a year but in the last year, have occurred more often about once every few months. This is very concerning is the patient continues to drive and I urged her that we need to do further workup. I was able to review an echocardiogram done on her in December of 2012 which noted a moderately decreased ejection fraction of 45% but otherwise unremarkable. Patient underwent carotid Dopplers today which were normal.  Given her chronic heart failure, I suspected this may be an underlying cause. Patient was not orthostatic during his hospitalization. There is no evidence of arrhythmia. Amitriptyline much of her syncope to her underlying heart failure, but will defer the rest of her PCP. She is stable and ambulating well. She is no physical therapy deficits. I'm recommending that she go back to independent living. She'll need to follow up with her PCP before she is cleared to drive.  An event monitor could be considered.   . Acute  systolic CHF (congestive heart failure): Patient is normally on Lasix 20 mg by mouth daily. She did not seem to be very well knowledge about her congestive heart failure as he should receive much education including checking her daily weights. We started her on aggressive IV Lasix and her BNP came down to 1400 which was initially as high as 2600. Have discharged her on Lasix 40 mg by mouth twice a day x1 week and then 40 mg by mouth daily. She'll follow up with her PCP in the next week.   . Hyperlipidemia: Stable. Continued on her ACE inhibitor.     Medication List  As of 08/17/2011  4:01 PM   TAKE these medications         aspirin EC 81 MG tablet   Take 81 mg by mouth daily.      atorvastatin 40 MG tablet   Commonly known as: LIPITOR   Take 40 mg by mouth daily.      carvedilol 25 MG tablet   Commonly known as: COREG   Take 25 mg by mouth 2 (two) times daily.      furosemide 40 MG tablet   Commonly known as: LASIX   Take 1 tablet (40 mg total) by mouth daily. Take 40 mg BID x 1 week, followed by 40 mg po daily.      telmisartan 40 MG tablet   Commonly known as: MICARDIS   Take 40 mg by mouth daily.             Day of Discharge BP 118/58  Pulse 73  Temp(Src) 97.8 F (36.6 C) (Oral)  Resp 18  Ht  5\' 5"  (1.651 m)  Wt 63.2 kg (139 lb 5.3 oz)  BMI 23.19 kg/m2  SpO2 99%  Physical Exam: General: Alert and oriented x3, no acute distress HEENT: Normocephalic and atraumatic, mucous members are moist Cardiovascular: Regular rate and rhythm, S1-S2 Lungs: Clear to auscultation bilaterally Abdomen: Soft, nontender, nondistended, positive bowel sounds Extremities: No clubbing or cyanosis or edema  Results for orders placed during the hospital encounter of 08/15/11 (from the past 24 hour(s))  BASIC METABOLIC PANEL     Status: Abnormal   Collection Time   08/17/11  5:36 AM      Component Value Range   Sodium 139  135 - 145 (mEq/L)   Potassium 3.4 (*) 3.5 - 5.1 (mEq/L)    Chloride 102  96 - 112 (mEq/L)   CO2 29  19 - 32 (mEq/L)   Glucose, Bld 92  70 - 99 (mg/dL)   BUN 15  6 - 23 (mg/dL)   Creatinine, Ser 1.61  0.50 - 1.10 (mg/dL)   Calcium 9.0  8.4 - 09.6 (mg/dL)   GFR calc non Af Amer 53 (*) >90 (mL/min)   GFR calc Af Amer 61 (*) >90 (mL/min)  PRO B NATRIURETIC PEPTIDE     Status: Abnormal   Collection Time   08/17/11  5:36 AM      Component Value Range   Pro B Natriuretic peptide (BNP) 1472.0 (*) 0 - 450 (pg/mL)    Disposition: improved, being discharged home   Follow-up Appts: Discharge Orders    Future Orders Please Complete By Expires   Diet - low sodium heart healthy      Increase activity slowly      Driving Restrictions      Comments:   no driving until you to follow up with her primary care doctor      Follow-up Information    Schedule an appointment as soon as possible for a visit with Darrow Bussing, MD. (this week)    Contact information:   [address] [place] [address] [phone]          Tests Needing Follow-up: none  Time spent in discharge (includes decision making & examination of pt): 40 minutes  Signed: Hollice Espy 08/17/2011, 4:01 PM

## 2011-08-17 NOTE — Plan of Care (Signed)
Problem: Phase I Progression Outcomes Goal: EF % per last Echo/documented,Core Reminder form on chart Outcome: Completed/Met Date Met:  08/17/11 45% - echo to be done this admit

## 2011-08-17 NOTE — Progress Notes (Signed)
Tele d/c'ed. IV d/c'ed. D/C instructions and medications discussed with pt and pt's grandson. Heart failure education teachback completed. Education pt not to drive until follow up with PCP. All questions answered. Pt states understanding. Pt refused wheelchair for d/c. Pt walked out by staff. Pt d/c'ed back to [facility].

## 2012-01-19 ENCOUNTER — Other Ambulatory Visit: Payer: Self-pay | Admitting: Family Medicine

## 2012-01-19 DIAGNOSIS — Z1231 Encounter for screening mammogram for malignant neoplasm of breast: Secondary | ICD-10-CM

## 2012-02-26 ENCOUNTER — Ambulatory Visit: Payer: Medicare Other

## 2012-03-28 ENCOUNTER — Ambulatory Visit: Payer: Medicare Other

## 2012-04-19 ENCOUNTER — Ambulatory Visit
Admission: RE | Admit: 2012-04-19 | Discharge: 2012-04-19 | Disposition: A | Discharge status: Home / Self Care | Payer: Medicare PPO | Source: Ambulatory Visit | Attending: Family Medicine | Admitting: Family Medicine

## 2012-04-19 DIAGNOSIS — Z1231 Encounter for screening mammogram for malignant neoplasm of breast: Secondary | ICD-10-CM

## 2013-01-09 ENCOUNTER — Ambulatory Visit: Payer: Self-pay | Admitting: Podiatry

## 2013-01-25 ENCOUNTER — Ambulatory Visit: Payer: Self-pay | Admitting: Podiatry

## 2013-02-13 ENCOUNTER — Ambulatory Visit (INDEPENDENT_AMBULATORY_CARE_PROVIDER_SITE_OTHER): Payer: Medicare PPO | Admitting: Podiatry

## 2013-02-13 ENCOUNTER — Encounter: Payer: Self-pay | Admitting: Podiatry

## 2013-02-13 VITALS — BP 100/53 | HR 80 | Resp 16

## 2013-02-13 DIAGNOSIS — M79609 Pain in unspecified limb: Secondary | ICD-10-CM

## 2013-02-13 DIAGNOSIS — B351 Tinea unguium: Secondary | ICD-10-CM

## 2013-02-13 NOTE — Progress Notes (Signed)
Subjective:     Patient ID: Michele Anderson, female   DOB: 1928/07/08, 79 y.o.   MRN: [id number]  HPI patient presents with nail disease 1-5 both feet that she states are sore thick and she cannot take care of herself   Review of Systems     Objective:   Physical Exam Mycotic painful nail bed 1-5 both feet with thickness and debris note    Assessment:     Mycotic infection nailbeds 1-5 both feet with pain    Plan:     Debridement of painful nailbeds 1-5 both feet

## 2013-05-15 ENCOUNTER — Ambulatory Visit: Payer: Medicare PPO | Admitting: Podiatry

## 2013-08-07 ENCOUNTER — Ambulatory Visit: Payer: Medicare PPO | Admitting: Podiatry

## 2013-08-21 ENCOUNTER — Ambulatory Visit: Payer: Medicare PPO | Admitting: Podiatry

## 2013-09-01 ENCOUNTER — Ambulatory Visit: Payer: Medicare PPO | Admitting: Podiatrist

## 2013-09-07 ENCOUNTER — Encounter: Payer: Self-pay | Admitting: *Deleted

## 2013-09-07 ENCOUNTER — Ambulatory Visit: Payer: Medicare PPO | Admitting: Podiatry

## 2013-09-07 ENCOUNTER — Encounter: Payer: Self-pay | Admitting: Cardiology

## 2013-09-25 ENCOUNTER — Encounter: Payer: Self-pay | Admitting: Podiatry

## 2013-09-25 ENCOUNTER — Ambulatory Visit (INDEPENDENT_AMBULATORY_CARE_PROVIDER_SITE_OTHER): Payer: Medicare PPO | Admitting: Podiatry

## 2013-09-25 DIAGNOSIS — L84 Corns and callosities: Secondary | ICD-10-CM

## 2013-09-25 DIAGNOSIS — B351 Tinea unguium: Secondary | ICD-10-CM

## 2013-09-25 DIAGNOSIS — M79609 Pain in unspecified limb: Secondary | ICD-10-CM

## 2013-09-25 DIAGNOSIS — M79673 Pain in unspecified foot: Secondary | ICD-10-CM

## 2013-09-27 NOTE — Progress Notes (Signed)
Subjective:     Patient ID: Michele CapesMarvadine Anderson, female   DOB: July 05, 1928, 80 y.o.   MRN: [id number]  HPI patient presents with painful nail disease 1-5 both feet that she cannot cut and lesion plantar aspect left foot that she is unable to take care of herself   Review of Systems     Objective:   Physical Exam Neurovascular status intact with thick yellow nailbeds 1-5 both feet that are painful and keratotic lesion sub-left foot    Assessment:     Mycotic nail infection with pain 1-5 both feet and lesion plantar left foot    Plan:     Debridement nailbeds 1-5 both feet and lesion left foot with no iatrogenic bleeding noted

## 2013-10-16 ENCOUNTER — Ambulatory Visit: Payer: Medicare PPO | Admitting: Podiatry

## 2013-10-25 ENCOUNTER — Ambulatory Visit (INDEPENDENT_AMBULATORY_CARE_PROVIDER_SITE_OTHER): Payer: Medicare PPO | Admitting: Podiatry

## 2013-10-25 DIAGNOSIS — L84 Corns and callosities: Secondary | ICD-10-CM

## 2013-10-25 DIAGNOSIS — M779 Enthesopathy, unspecified: Secondary | ICD-10-CM

## 2013-10-25 MED ORDER — TRIAMCINOLONE ACETONIDE 10 MG/ML IJ SUSP
10.0000 mg | Freq: Once | INTRAMUSCULAR | Status: AC
  Administered 2013-10-25: 10 mg

## 2013-10-26 NOTE — Progress Notes (Signed)
Subjective:     Patient ID: Michele CapesMarvadine Griffee, female   DOB: 1929/01/31, 80 y.o.   MRN: [id number]  HPI patient presents with inflammation around the fifth metatarsal head right with lesion formation that she states remains symptomatic   Review of Systems     Objective:   Physical Exam Neurovascular status intact with inflammation and fluid around the fifth MPJ with pain upon pressure    Assessment:     Inflammatory capsulitis fifth MPJ right with pain    Plan:     Injected the capsule of right fifth MPJ 3 mg dexamethasone Kenalog and debrided the lesion after appropriate numbness

## 2013-12-14 ENCOUNTER — Other Ambulatory Visit: Payer: Self-pay | Admitting: Family Medicine

## 2013-12-14 ENCOUNTER — Ambulatory Visit
Admission: RE | Admit: 2013-12-14 | Discharge: 2013-12-14 | Disposition: A | Discharge status: Home / Self Care | Payer: Medicare PPO | Source: Ambulatory Visit | Attending: Family Medicine | Admitting: Family Medicine

## 2013-12-14 DIAGNOSIS — R042 Hemoptysis: Secondary | ICD-10-CM

## 2013-12-15 ENCOUNTER — Other Ambulatory Visit (HOSPITAL_COMMUNITY): Payer: Self-pay | Admitting: Family Medicine

## 2013-12-15 DIAGNOSIS — R042 Hemoptysis: Secondary | ICD-10-CM

## 2013-12-18 ENCOUNTER — Other Ambulatory Visit: Payer: Medicare PPO

## 2013-12-19 ENCOUNTER — Ambulatory Visit (HOSPITAL_COMMUNITY)
Admission: RE | Admit: 2013-12-19 | Discharge: 2013-12-19 | Disposition: A | Discharge status: Home / Self Care | Payer: Medicare PPO | Source: Ambulatory Visit | Attending: Family Medicine | Admitting: Family Medicine

## 2013-12-19 DIAGNOSIS — K920 Hematemesis: Secondary | ICD-10-CM | POA: Diagnosis not present

## 2013-12-19 DIAGNOSIS — R079 Chest pain, unspecified: Secondary | ICD-10-CM | POA: Diagnosis not present

## 2013-12-19 DIAGNOSIS — R0602 Shortness of breath: Secondary | ICD-10-CM | POA: Insufficient documentation

## 2013-12-19 DIAGNOSIS — R042 Hemoptysis: Secondary | ICD-10-CM | POA: Diagnosis not present

## 2013-12-19 MED ORDER — IOHEXOL 300 MG/ML  SOLN
80.0000 mL | Freq: Once | INTRAMUSCULAR | Status: AC | PRN
  Administered 2013-12-19: 80 mL via INTRAVENOUS

## 2013-12-25 ENCOUNTER — Ambulatory Visit: Payer: Medicare PPO | Admitting: Podiatry

## 2014-01-18 ENCOUNTER — Other Ambulatory Visit: Payer: Self-pay | Admitting: Family Medicine

## 2014-01-18 ENCOUNTER — Ambulatory Visit
Admission: RE | Admit: 2014-01-18 | Discharge: 2014-01-18 | Disposition: A | Discharge status: Home / Self Care | Payer: Medicare PPO | Source: Ambulatory Visit | Attending: Family Medicine | Admitting: Family Medicine

## 2014-01-18 DIAGNOSIS — J189 Pneumonia, unspecified organism: Secondary | ICD-10-CM

## 2014-04-26 ENCOUNTER — Ambulatory Visit: Payer: Medicare PPO | Admitting: Podiatry

## 2014-05-09 ENCOUNTER — Ambulatory Visit (INDEPENDENT_AMBULATORY_CARE_PROVIDER_SITE_OTHER): Payer: Medicare PPO | Admitting: Podiatry

## 2014-05-09 ENCOUNTER — Encounter: Payer: Self-pay | Admitting: Podiatry

## 2014-05-09 VITALS — BP 92/46 | HR 71 | Resp 18

## 2014-05-09 DIAGNOSIS — B351 Tinea unguium: Secondary | ICD-10-CM | POA: Diagnosis not present

## 2014-05-09 DIAGNOSIS — M79676 Pain in unspecified toe(s): Secondary | ICD-10-CM | POA: Diagnosis not present

## 2014-05-10 NOTE — Progress Notes (Signed)
Patient ID: Michele Anderson, female   DOB: 1928-11-27, 81 y.o.   MRN: [id number]  Subjective: 81 y.o.-year-old female returns the office today for painful, elongated, thickened toenails. Also states she has a painful corn that needs to be trimmed. Denies any redness or drainage around the nails. Denies any acute changes since last appointment and no new complaints today. Denies any systemic complaints such as fevers, chills, nausea, vomiting.   Objective: AAO 3, NAD DP/PT pulses palpable, CRT less than 3 seconds Protective sensation intact with Simms Weinstein monofilament, Achilles tendon reflex intact.  Nails hypertrophic, dystrophic, elongated, brittle, discolored 10. There is tenderness overlying these nails. There is no surrounding erythema or drainage along the nail sites. No open lesions identified. There is a small hyperkeratotic lesion on the dorsal aspect of the right fifth digit. Upon debridement of the lesion there is no underlying ulceration, drainage or other signs of infection. No other areas of tenderness bilateral lower extremities. No overlying edema, erythema, increased warmth. No pain with calf compression, swelling, warmth, erythema.  Assessment: Patient presents with symptomatic onychomycosis  Plan: -Treatment options including alternatives, risks, complications were discussed -Nails sharply debrided 10 without complication/bleeding. -Hyperkeratotic lesion sharply debrided 1 without complications. -Discussed daily foot inspection. If there are any changes, to call the office immediately.  -She will call as needed for follow-up per her request. In the meantime, encouraged to call the office with any questions, concerns, changes symptoms.

## 2014-10-08 DEATH — deceased

## 2016-08-19 IMAGING — CR DG CHEST 2V
2 series · 2 of 2 positions shown · non-contrast
Comparison: 08/15/2011

CLINICAL DATA: Hemoptysis

EXAM:
CHEST  2 VIEW

[w chest pa]
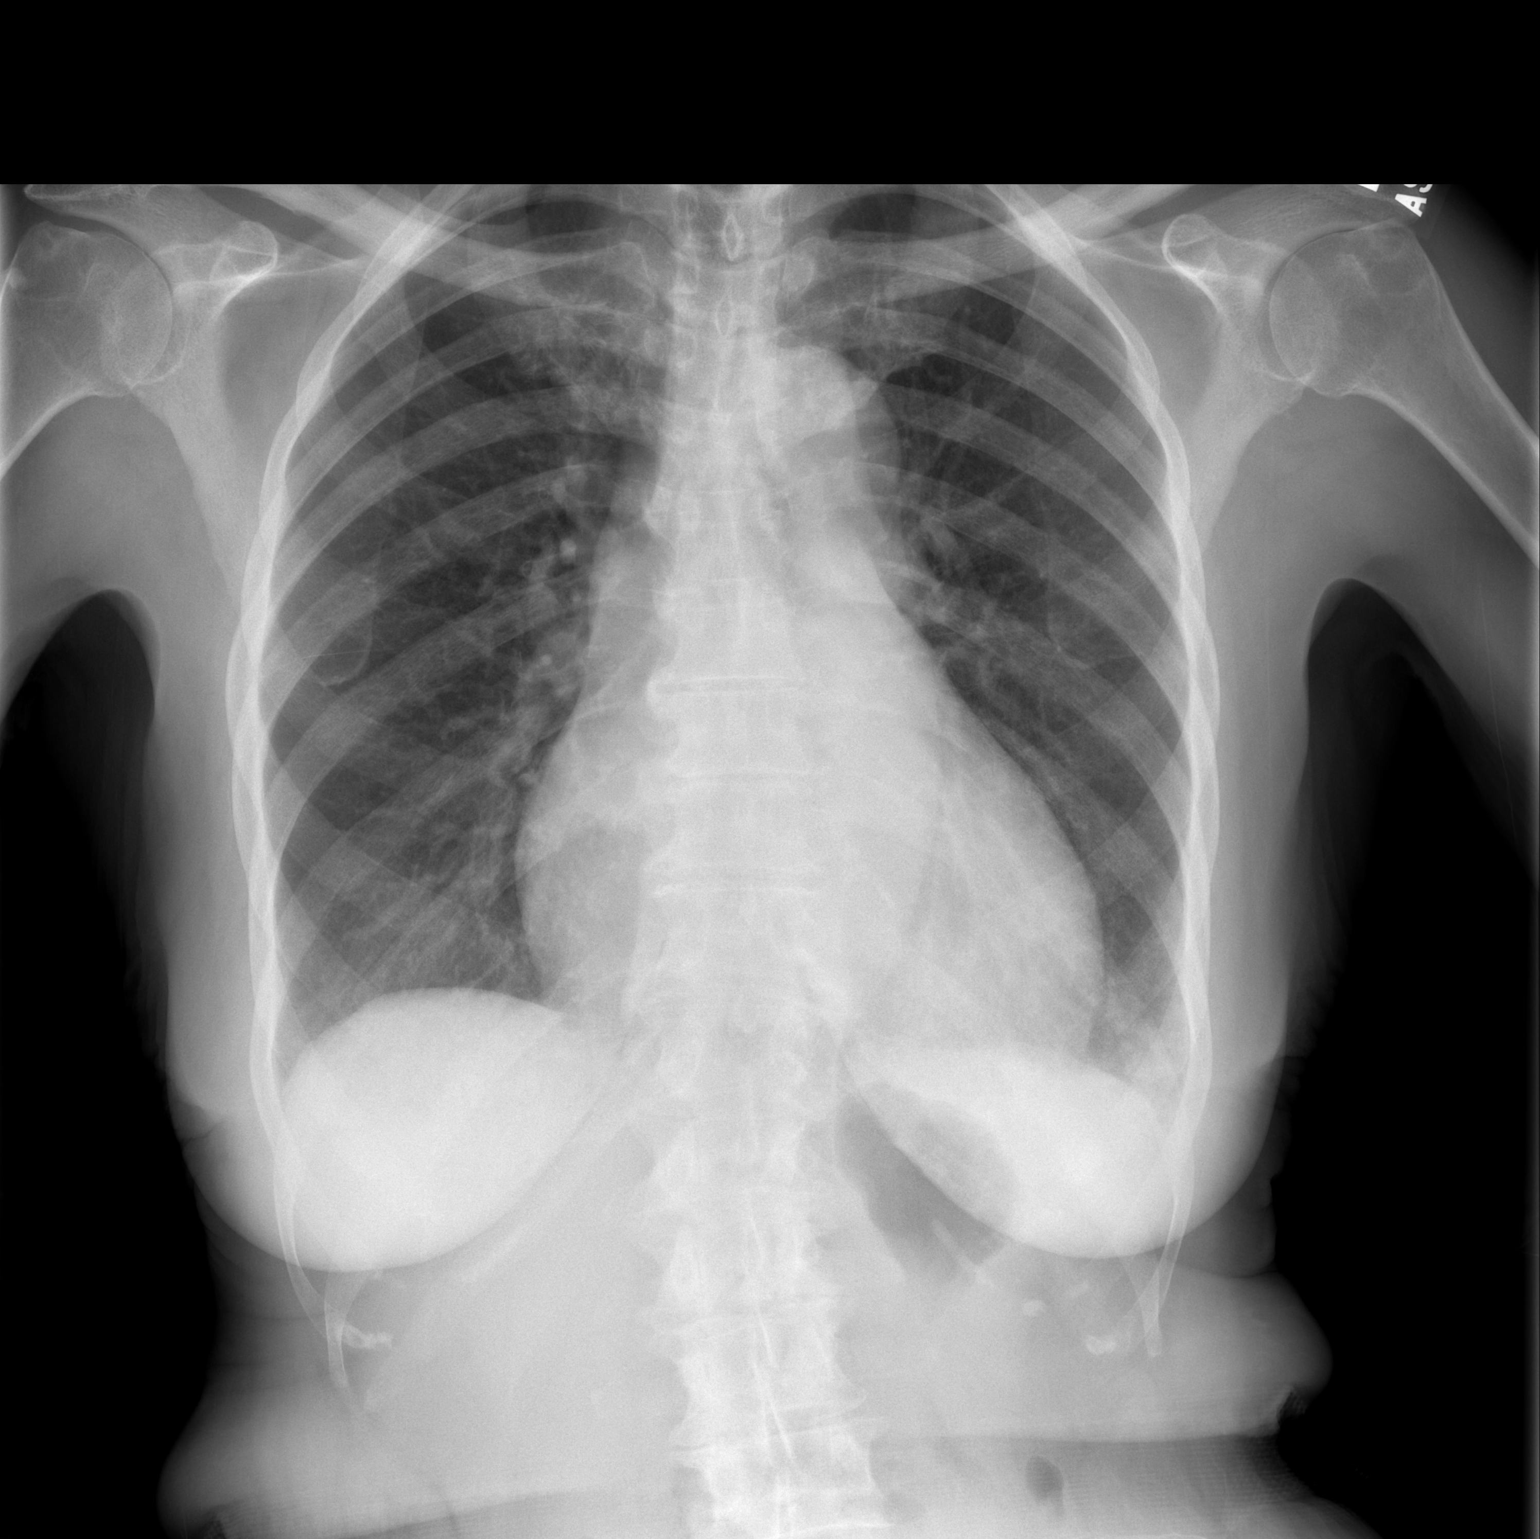

[w chest lat]
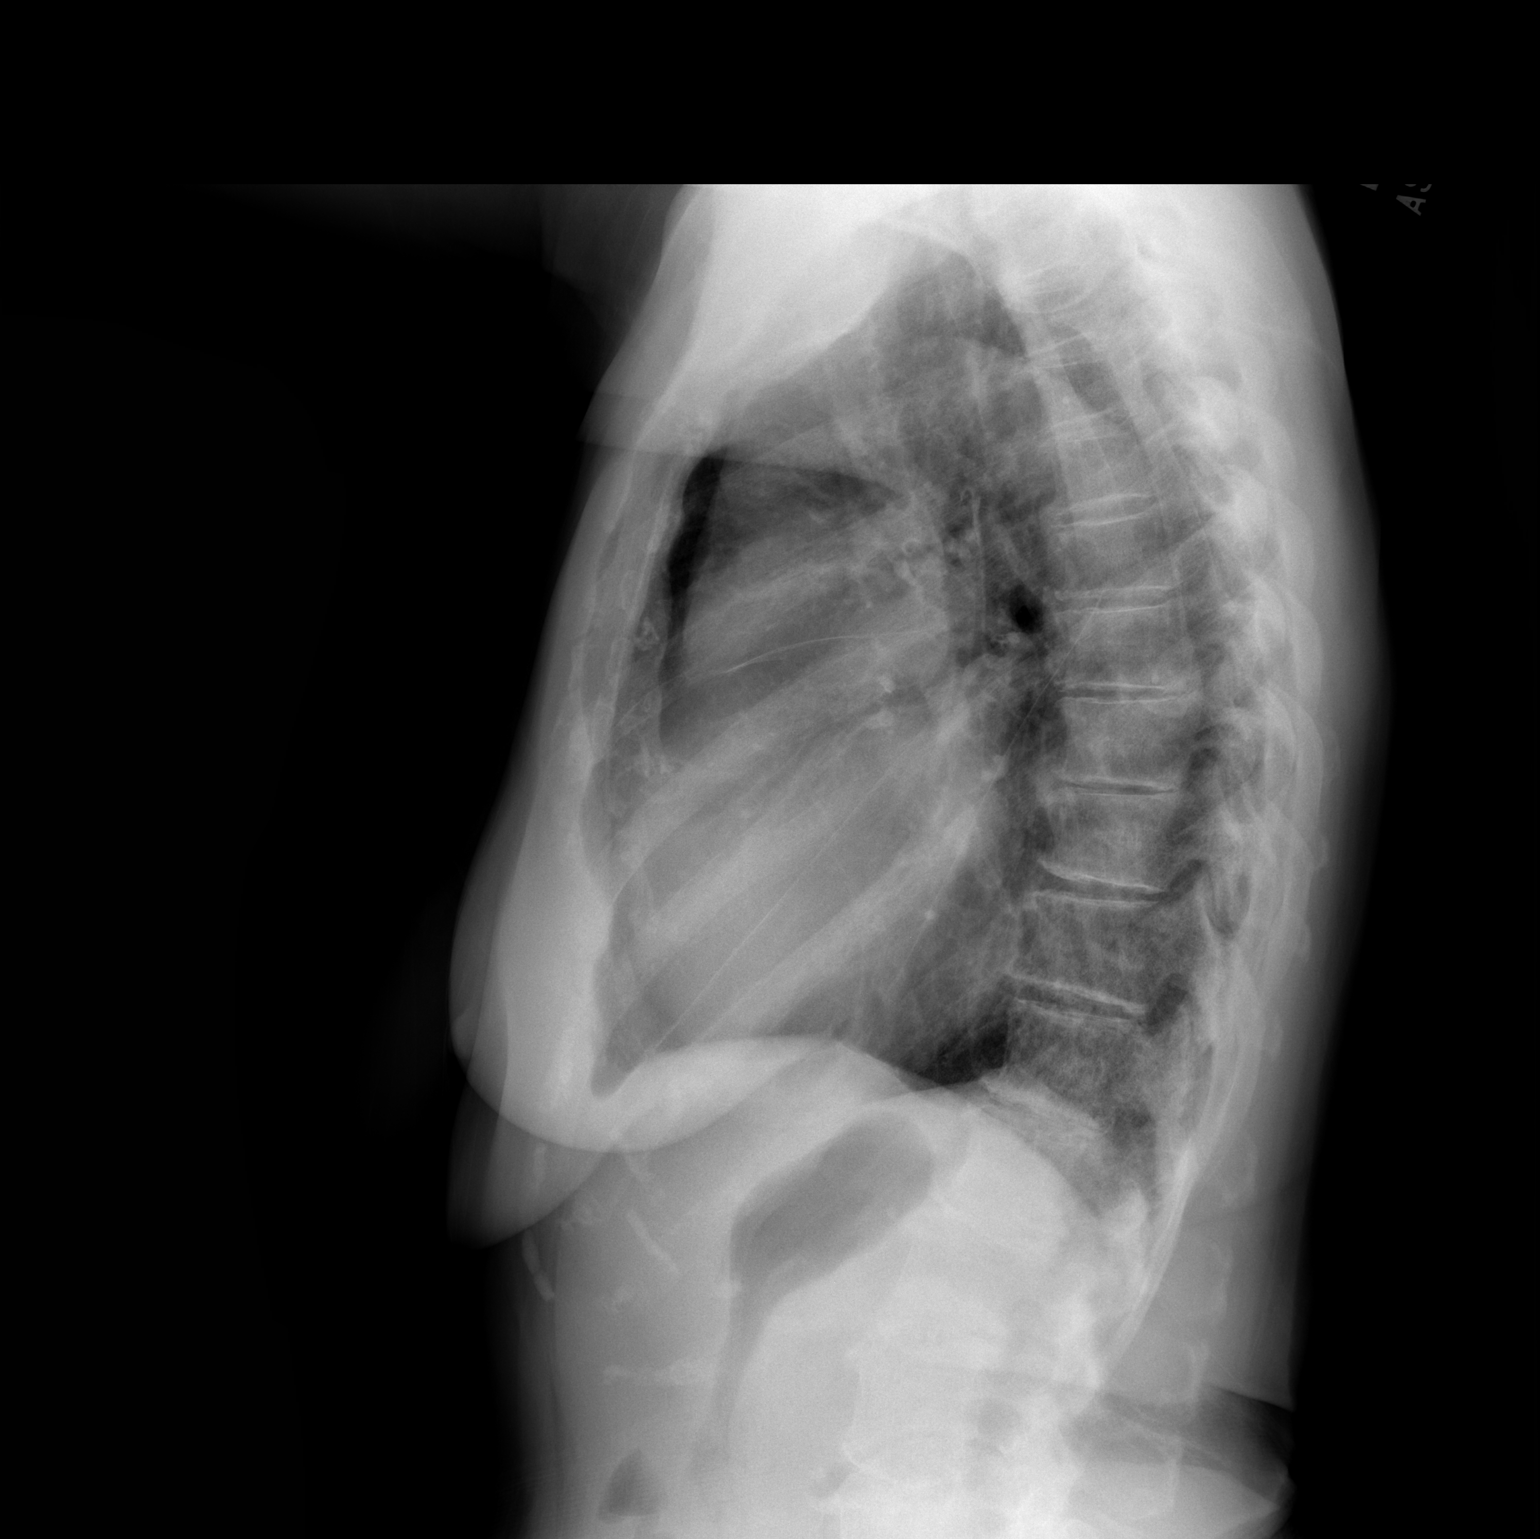

[2 of 2 positions shown; findings below may reference images not displayed]

FINDINGS: Patchy infiltrate left lower lobe may represent pneumonia or blood.
No significant effusion. Right lung is clear

Cardiac enlargement without heart failure.  No mass lesion.
IMPRESSION: Left lower lobe infiltrate may represent pneumonia or blood in the
lung. No underlying mass lesion identified.
